# Patient Record
Sex: Female | Born: 1965 | Race: White | Hispanic: No | Marital: Married | Smoking: Never smoker
Health system: Southern US, Community
[De-identification: ages and names within clinical notes are randomized; demographics above are authoritative.]

## PROBLEM LIST (undated history)

## (undated) DIAGNOSIS — D229 Melanocytic nevi, unspecified: Secondary | ICD-10-CM

## (undated) HISTORY — PX: BREAST CYST EXCISION: SHX579

## (undated) HISTORY — DX: Melanocytic nevi, unspecified: D22.9

---

## 2013-08-09 DIAGNOSIS — D229 Melanocytic nevi, unspecified: Secondary | ICD-10-CM

## 2013-08-09 DIAGNOSIS — C4491 Basal cell carcinoma of skin, unspecified: Secondary | ICD-10-CM

## 2013-08-09 HISTORY — DX: Melanocytic nevi, unspecified: D22.9

## 2013-08-09 HISTORY — DX: Basal cell carcinoma of skin, unspecified: C44.91

## 2014-08-04 HISTORY — PX: BACK SURGERY: SHX140

## 2018-05-28 ENCOUNTER — Other Ambulatory Visit: Payer: Self-pay | Admitting: Family Medicine

## 2018-05-28 DIAGNOSIS — Z1231 Encounter for screening mammogram for malignant neoplasm of breast: Secondary | ICD-10-CM

## 2018-06-09 ENCOUNTER — Ambulatory Visit: Payer: Managed Care, Other (non HMO)

## 2018-06-17 ENCOUNTER — Ambulatory Visit: Payer: Managed Care, Other (non HMO)

## 2018-06-24 ENCOUNTER — Ambulatory Visit (INDEPENDENT_AMBULATORY_CARE_PROVIDER_SITE_OTHER): Payer: Managed Care, Other (non HMO)

## 2018-06-24 DIAGNOSIS — Z1231 Encounter for screening mammogram for malignant neoplasm of breast: Secondary | ICD-10-CM

## 2018-08-30 ENCOUNTER — Other Ambulatory Visit: Payer: Self-pay | Admitting: Family Medicine

## 2018-08-30 DIAGNOSIS — E041 Nontoxic single thyroid nodule: Secondary | ICD-10-CM

## 2018-09-01 ENCOUNTER — Other Ambulatory Visit: Payer: Self-pay | Admitting: Family Medicine

## 2018-09-01 DIAGNOSIS — G96198 Other disorders of meninges, not elsewhere classified: Secondary | ICD-10-CM

## 2018-09-01 DIAGNOSIS — G9619 Other disorders of meninges, not elsewhere classified: Principal | ICD-10-CM

## 2018-09-10 ENCOUNTER — Ambulatory Visit
Admission: RE | Admit: 2018-09-10 | Discharge: 2018-09-10 | Disposition: A | Discharge status: Home / Self Care | Payer: Managed Care, Other (non HMO) | Source: Ambulatory Visit | Attending: Family Medicine | Admitting: Family Medicine

## 2018-09-10 DIAGNOSIS — E041 Nontoxic single thyroid nodule: Secondary | ICD-10-CM

## 2018-09-20 ENCOUNTER — Ambulatory Visit (INDEPENDENT_AMBULATORY_CARE_PROVIDER_SITE_OTHER): Payer: Managed Care, Other (non HMO)

## 2018-09-20 DIAGNOSIS — G96198 Other disorders of meninges, not elsewhere classified: Secondary | ICD-10-CM

## 2018-09-20 DIAGNOSIS — M5136 Other intervertebral disc degeneration, lumbar region: Secondary | ICD-10-CM

## 2018-09-20 DIAGNOSIS — G9619 Other disorders of meninges, not elsewhere classified: Principal | ICD-10-CM

## 2018-09-20 MED ORDER — GADOBENATE DIMEGLUMINE 529 MG/ML IV SOLN
15.0000 mL | Freq: Once | INTRAVENOUS | Status: AC | PRN
  Administered 2018-09-20: 13 mL via INTRAVENOUS

## 2018-12-29 ENCOUNTER — Other Ambulatory Visit: Payer: Self-pay | Admitting: Family Medicine

## 2018-12-29 DIAGNOSIS — M545 Low back pain, unspecified: Secondary | ICD-10-CM

## 2018-12-29 DIAGNOSIS — N281 Cyst of kidney, acquired: Secondary | ICD-10-CM

## 2018-12-29 DIAGNOSIS — G8929 Other chronic pain: Secondary | ICD-10-CM

## 2019-01-06 ENCOUNTER — Ambulatory Visit (INDEPENDENT_AMBULATORY_CARE_PROVIDER_SITE_OTHER): Payer: Managed Care, Other (non HMO)

## 2019-01-06 ENCOUNTER — Other Ambulatory Visit: Payer: Self-pay

## 2019-01-06 DIAGNOSIS — M545 Low back pain, unspecified: Secondary | ICD-10-CM

## 2019-01-06 DIAGNOSIS — G8929 Other chronic pain: Secondary | ICD-10-CM

## 2019-01-06 DIAGNOSIS — N281 Cyst of kidney, acquired: Secondary | ICD-10-CM | POA: Diagnosis not present

## 2019-03-22 DIAGNOSIS — C439 Malignant melanoma of skin, unspecified: Secondary | ICD-10-CM

## 2019-03-22 HISTORY — DX: Malignant melanoma of skin, unspecified: C43.9

## 2019-04-09 IMAGING — US US THYROID
1 series · 13 of 25 positions shown · non-contrast
Comparison: None.

CLINICAL DATA: Incidental on MRI. History of right-sided thyroid
lobectomy now with thyroid nodule demonstrated on outside MRI.

EXAM:
THYROID ULTRASOUND
TECHNIQUE: Ultrasound examination of the thyroid gland and adjacent soft
tissues was performed.

[Series 1: us thyroid · 0.07mm/px · 13 of 28 slices shown]
[im 1/28]
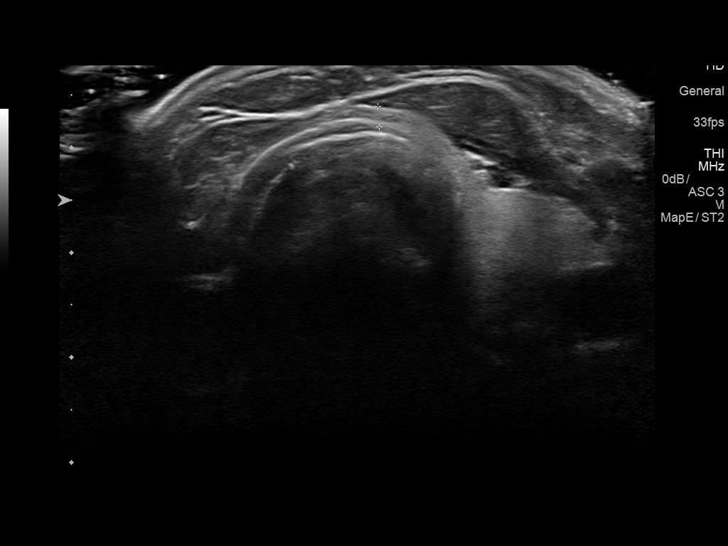
[im 3/28]
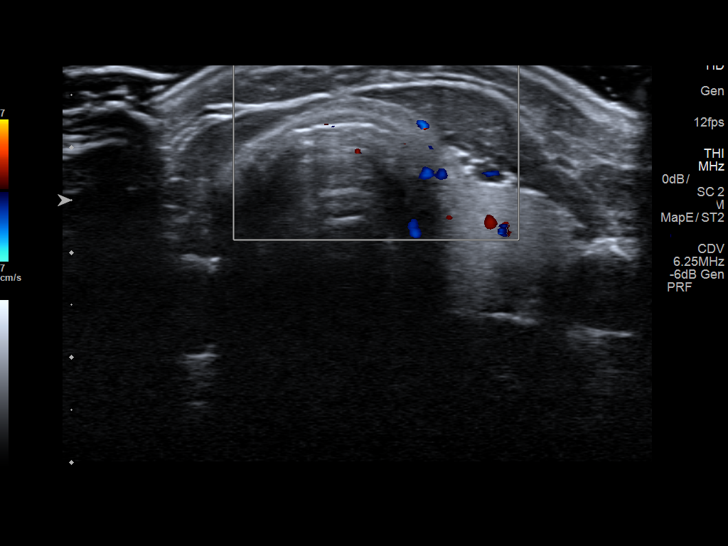
[im 5/28]
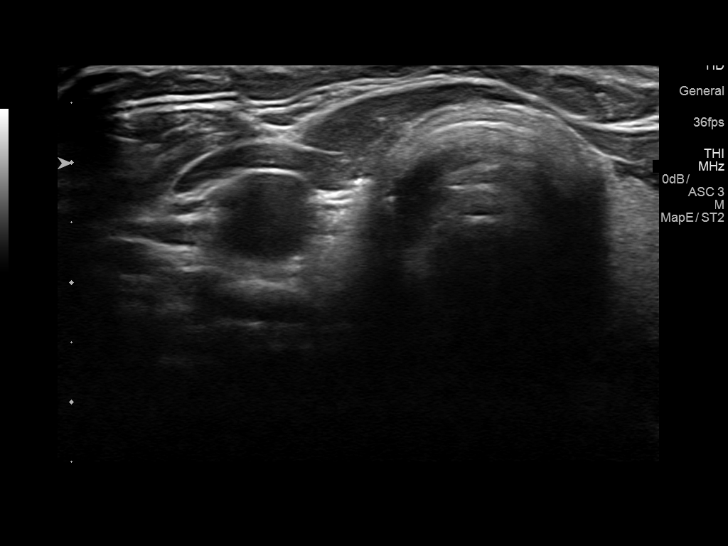
[im 7/28]
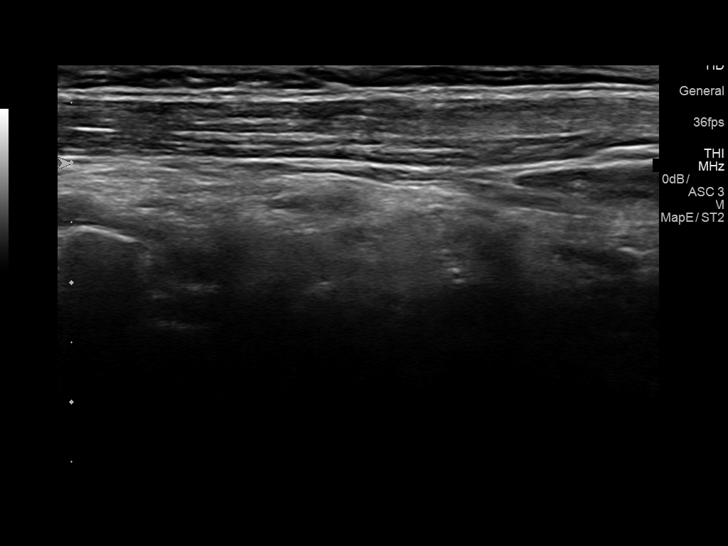
[im 10/28]
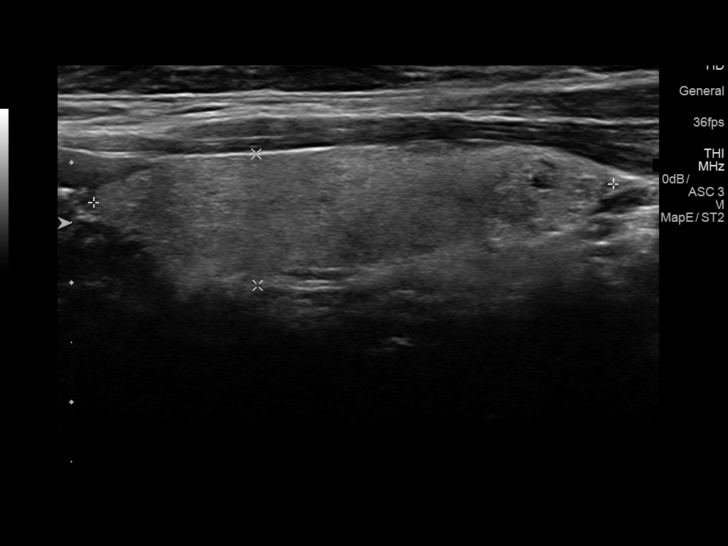
[im 12/28]
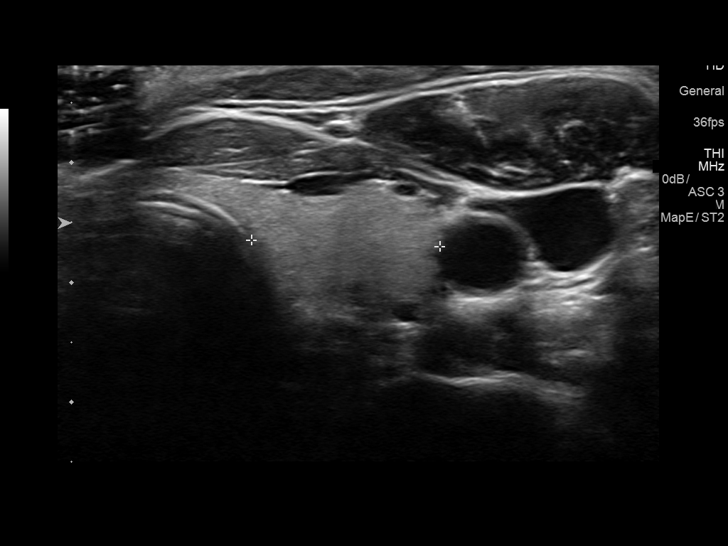
[im 14/28]
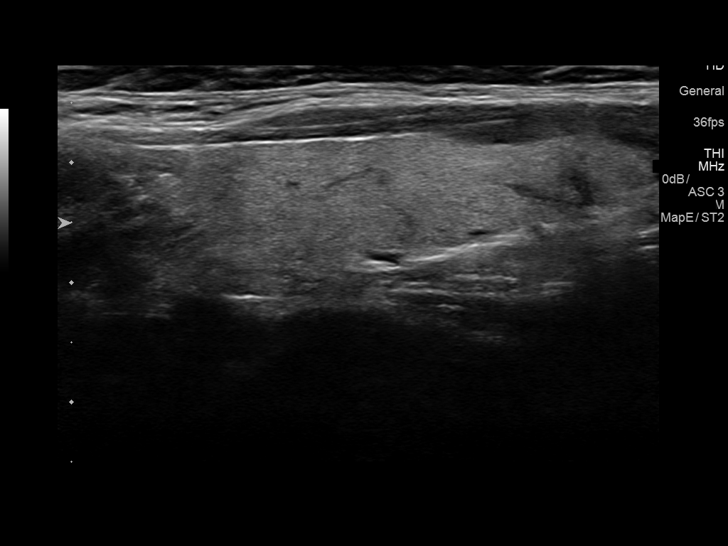
[im 16/28]
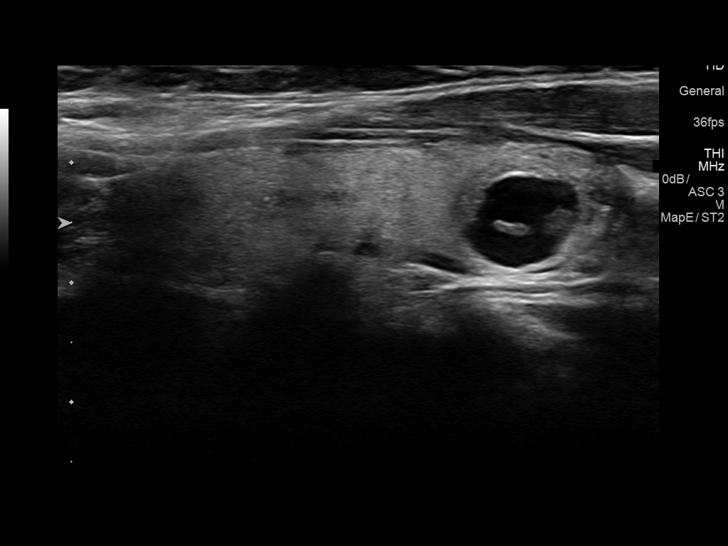
[im 19/28]
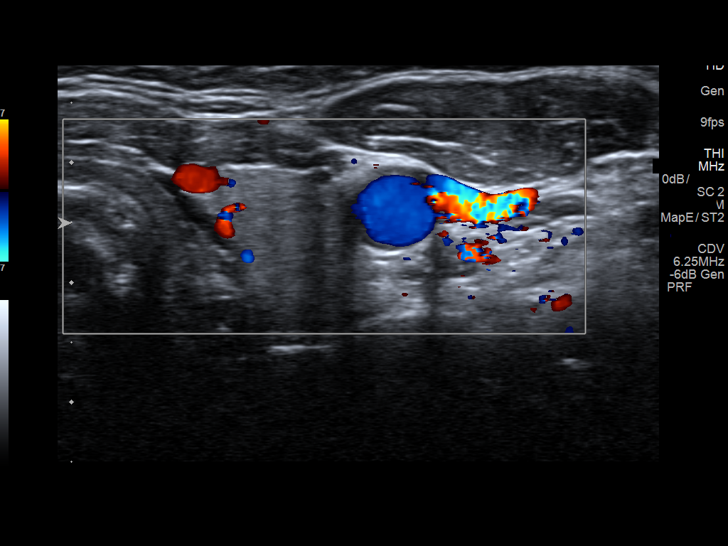
[im 21/28]
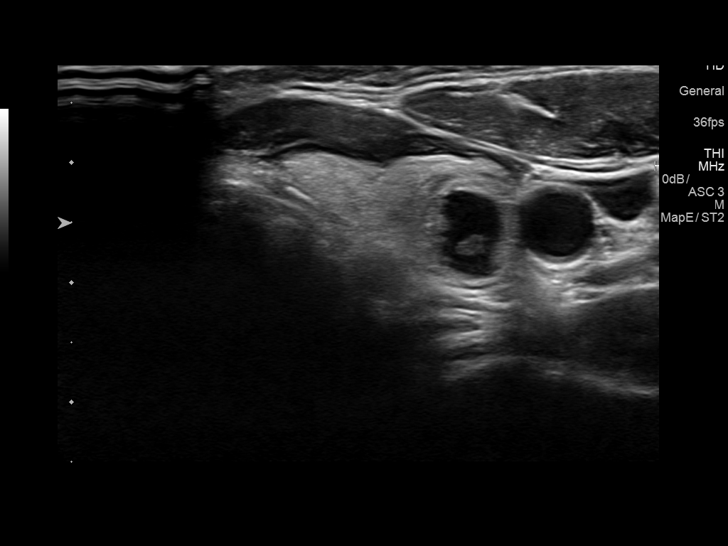
[im 23/28]
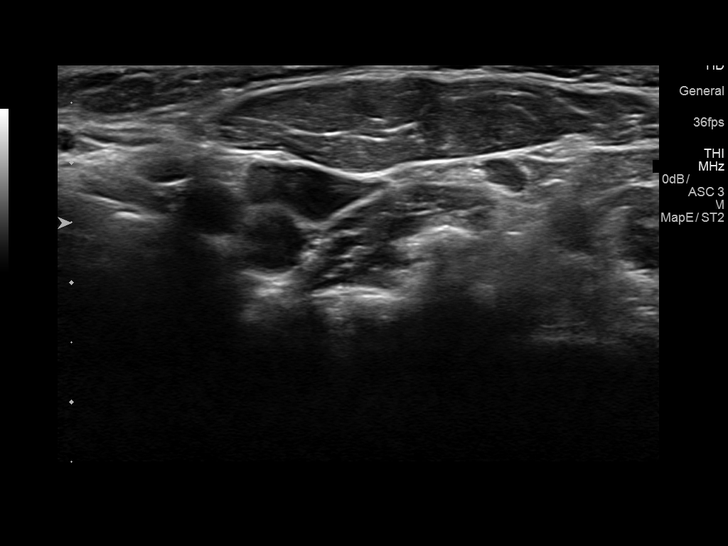
[im 25/28]
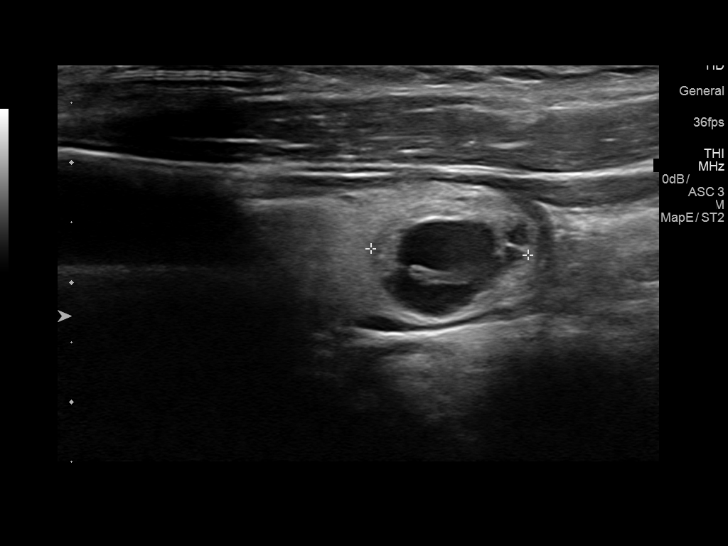
[im 28/28]
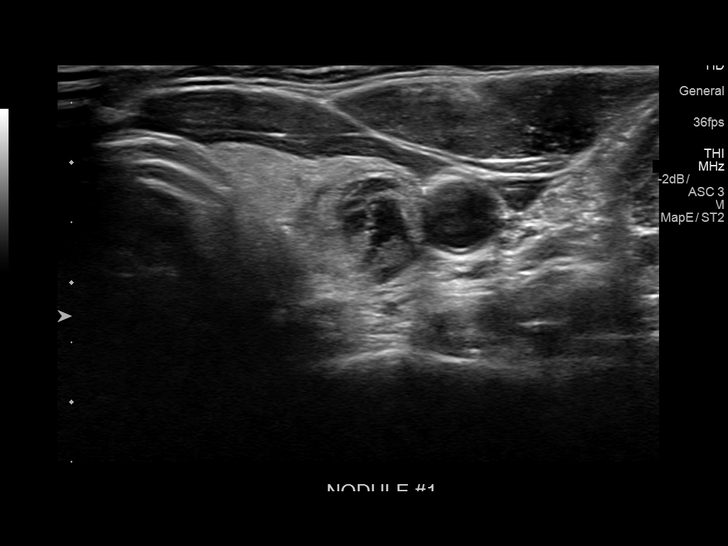

[13 of 25 positions shown; findings below may reference images not displayed]

FINDINGS: Parenchymal Echotexture: Mildly heterogenous

Isthmus: Normal in size measures 0.2 cm in diameter

Right lobe: Surgically absent. There is no residual nodular soft
tissue within the right lobectomy resection bed.

Left lobe: Normal in size measuring 4.3 x 1.1 x 1.6 cm

_________________________________________________________

Estimated total number of nodules >/= 1 cm: 1

Number of spongiform nodules >/=  2 cm not described below (TR1): 0

Number of mixed cystic and solid nodules >/= 1.5 cm not described
below (TR2): 0

_________________________________________________________

Nodule # 1:

Location: Left; Inferior

Maximum size: 1.3 cm; Other 2 dimensions: 0.9 x 0.8 cm

Composition: mixed cystic and solid (1)

Echogenicity: isoechoic (1)

Shape: not taller-than-wide (0)

Margins: ill-defined (0)

Echogenic foci: none (0)

ACR TI-RADS total points: 2.

ACR TI-RADS risk category: TR2 (2 points).

ACR TI-RADS recommendations:

This nodule does NOT meet TI-RADS criteria for biopsy or dedicated
follow-up.

_________________________________________________________
IMPRESSION: 1. Post right-sided thyroid lobectomy without evidence of residual
nodular soft tissue within the right lobectomy resection bed.
2. Solitary partially cystic, partially solid nodule within the
inferior pole the left lobe of the thyroid does not meet imaging
criteria to recommend percutaneous sampling or continued dedicated
follow-up.

The above is in keeping with the ACR TI-RADS recommendations - [HOSPITAL] 4284;[DATE].

## 2019-05-20 ENCOUNTER — Other Ambulatory Visit: Payer: Self-pay | Admitting: Family Medicine

## 2019-05-20 DIAGNOSIS — Z1231 Encounter for screening mammogram for malignant neoplasm of breast: Secondary | ICD-10-CM

## 2019-06-08 DIAGNOSIS — D229 Melanocytic nevi, unspecified: Secondary | ICD-10-CM

## 2019-06-08 HISTORY — DX: Melanocytic nevi, unspecified: D22.9

## 2019-07-07 ENCOUNTER — Ambulatory Visit: Payer: Managed Care, Other (non HMO)

## 2019-07-13 ENCOUNTER — Ambulatory Visit (INDEPENDENT_AMBULATORY_CARE_PROVIDER_SITE_OTHER): Payer: Managed Care, Other (non HMO) | Admitting: Cardiology

## 2019-07-13 ENCOUNTER — Encounter: Payer: Self-pay | Admitting: Cardiology

## 2019-07-13 ENCOUNTER — Encounter: Payer: Self-pay | Admitting: *Deleted

## 2019-07-13 ENCOUNTER — Ambulatory Visit: Payer: Managed Care, Other (non HMO)

## 2019-07-13 ENCOUNTER — Other Ambulatory Visit: Payer: Self-pay

## 2019-07-13 VITALS — BP 120/78 | HR 64 | Ht 67.0 in | Wt 130.0 lb

## 2019-07-13 DIAGNOSIS — Z01812 Encounter for preprocedural laboratory examination: Secondary | ICD-10-CM

## 2019-07-13 DIAGNOSIS — R079 Chest pain, unspecified: Secondary | ICD-10-CM | POA: Diagnosis not present

## 2019-07-13 DIAGNOSIS — R002 Palpitations: Secondary | ICD-10-CM | POA: Diagnosis not present

## 2019-07-13 DIAGNOSIS — R0600 Dyspnea, unspecified: Secondary | ICD-10-CM | POA: Diagnosis not present

## 2019-07-13 MED ORDER — METOPROLOL TARTRATE 50 MG PO TABS: ORAL_TABLET | ORAL | 0 refills | Status: DC

## 2019-07-13 NOTE — Patient Instructions (Signed)
Medication Instructions:  Your physician recommends that you continue on your current medications as directed. Please refer to the Current Medication list given to you today.  *If you need a refill on your cardiac medications before your next appointment, please call your pharmacy*  Lab Work: Your physician recommends that you return for lab work in:   3-7 days prior to CT: BMP  If you have labs (blood work) drawn today and your tests are completely normal, you will receive your results only by: [place] MyChart Message (if you have MyChart) OR . A paper copy in the mail If you have any lab test that is abnormal or we need to change your treatment, we will call you to review the results.  Testing/Procedures: Your physician has requested that you have an echocardiogram. Echocardiography is a painless test that uses sound waves to create images of your heart. It provides your doctor with information about the size and shape of your heart and how well your heart's chambers and valves are working. This procedure takes approximately one hour. There are no restrictions for this procedure.  A zio monitor was placed today. It will remain on for 14 days. You will then return monitor and event diary in provided box. It takes 1-2 weeks for report to be downloaded and returned to [place]. We will call you with the results. If monitor falls off or has orange flashing light, please call Zio for further instructions.   Your physician has requested that you have a lexiscan myoview. For further information please visit [email or web address]. Please follow instruction sheet, as given.          Follow-Up: At [facility], you and your health needs are our priority.  As part of our continuing mission to provide you with exceptional heart care, we have created designated Provider Care Teams.  These Care Teams include your primary Cardiologist (physician) and Advanced Practice Providers (APPs -  Physician Assistants and  Nurse Practitioners) who all work together to provide you with the care you need, when you need it.  Your next appointment:   3 month(s)  The format for your next appointment:   In Person  Provider:   Berniece Salines, DO  Other Instructions  Cardiac Nuclear Scan A cardiac nuclear scan is a test that is done to check the flow of blood to your heart. It is done when you are resting and when you are exercising. The test looks for problems such as:  Not enough blood reaching a portion of the heart.  The heart muscle not working as it should. You may need this test if:  You have heart disease.  You have had lab results that are not normal.  You have had heart surgery or a balloon procedure to open up blocked arteries (angioplasty).  You have chest pain.  You have shortness of breath. In this test, a special dye (tracer) is put into your bloodstream. The tracer will travel to your heart. A camera will then take pictures of your heart to see how the tracer moves through your heart. This test is usually done at a hospital and takes 2-4 hours. Tell a doctor about:  Any allergies you have.  All medicines you are taking, including vitamins, herbs, eye drops, creams, and over-the-counter medicines.  Any problems you or family members have had with anesthetic medicines.  Any blood disorders you have.  Any surgeries you have had.  Any medical conditions you have.  Whether you are pregnant or  may be pregnant. What are the risks? Generally, this is a safe test. However, problems may occur, such as:  Serious chest pain and heart attack. This is only a risk if the stress portion of the test is done.  Rapid heartbeat.  A feeling of warmth in your chest. This feeling usually does not last long.  Allergic reaction to the tracer. What happens before the test?  Ask your doctor about changing or stopping your normal medicines. This is important.  Follow instructions from your doctor  about what you cannot eat or drink.  Remove your jewelry on the day of the test. What happens during the test?  An IV tube will be inserted into one of your veins.  Your doctor will give you a small amount of tracer through the IV tube.  You will wait for 20-40 minutes while the tracer moves through your bloodstream.  Your heart will be monitored with an electrocardiogram (ECG).  You will lie down on an exam table.  Pictures of your heart will be taken for about 15-20 minutes.  You may also have a stress test. For this test, one of these things may be done: ? You will be asked to exercise on a treadmill or a stationary bike. ? You will be given medicines that will make your heart work harder. This is done if you are unable to exercise.  When blood flow to your heart has peaked, a tracer will again be given through the IV tube.  After 20-40 minutes, you will get back on the exam table. More pictures will be taken of your heart.  Depending on the tracer that is used, more pictures may need to be taken 3-4 hours later.  Your IV tube will be removed when the test is over. The test may vary among doctors and hospitals. What happens after the test?  Ask your doctor: ? Whether you can return to your normal schedule, including diet, activities, and medicines. ? Whether you should drink more fluids. This will help to remove the tracer from your body. Drink enough fluid to keep your pee (urine) pale yellow.  Ask your doctor, or the department that is doing the test: ? When will my results be ready? ? How will I get my results? Summary  A cardiac nuclear scan is a test that is done to check the flow of blood to your heart.  Tell your doctor whether you are pregnant or may be pregnant.  Before the test, ask your doctor about changing or stopping your normal medicines. This is important.  Ask your doctor whether you can return to your normal activities. You may be asked to drink more  fluids. This information is not intended to replace advice given to you by your health care provider. Make sure you discuss any questions you have with your health care provider. Document Released: 01/04/2018 Document Revised: 11/10/2018 Document Reviewed: 01/04/2018 Elsevier Patient Education  2020 [employer].

## 2019-07-13 NOTE — Progress Notes (Signed)
Cardiology Office Note:    Date:  07/13/2019   ID:  Christine Irwin, DOB August 11, 1965, MRN [id number]  PCP:  Aura Dials, MD  Cardiologist:  Berniece Salines, DO  Electrophysiologist:  None   Referring MD: Aura Dials, MD   Chief Complaint  Patient presents with   Palpitations    History of Present Illness:    Christine Irwin is a 53 y.o. female with a hx of hypothyroidism, history of melanoma and family history of premature coronary disease presents today to be evaluated for palpitations, and chest pain.  Patient tells me that over 5 years she has been experiencing intermittent palpitations but recently this has become worse.  She notes that gets these increase the frequency of rapid onset of fast heartbeat which goes for seconds and sometimes many other times prior to resolution.  It is associated with shortness of breath.  She admits that she sometimes has some lightheadedness with her palpitations.  In addition he has been experiencing midsternal intermittent chest pain which lasts for 10 to 15 seconds prior to resolution.  She denies any radiation, and states that she does have associated shortness of breath with this as well.  Nothing has made this better or worse.  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   Calcium Carbonate-Vitamin D 600-400 MG-UNIT tablet Take by mouth.   diphenhydrAMINE HCl (ALLERGY MEDICATION PO) Take by mouth.   levothyroxine (SYNTHROID) 50 MCG tablet Take 50 mcg by mouth daily.   Multiple Vitamin (MULTIVITAMIN) tablet Take by mouth.   Probiotic Product (PROBIOTIC-10 PO) Take by mouth.     Allergies:   Eggs or egg-derived products and Iodine   Social History   Socioeconomic History   Marital status:  Married    Spouse name: Not on file   Number of children: Not on file   Years of education: Not on file   Highest education level: Not on file  Occupational History   Not on file  Social Needs   Financial resource strain: Not on file   Food insecurity    Worry: Not on file    Inability: Not on file   Transportation needs    Medical: Not on file    Non-medical: Not on file  Tobacco Use   Smoking status: Never Smoker   Smokeless tobacco: Never Used  Substance and Sexual Activity   Alcohol use: Never    Frequency: Never   Drug use: Never   Sexual activity: Not on file  Lifestyle   Physical activity    Days per week: Not on file    Minutes per session: Not on file   Stress: Not on file  Relationships   Social connections    Talks on phone: Not on file    Gets together: Not on file    Attends religious service: Not on file    Active member of club or organization: Not on file    Attends meetings of clubs or organizations: Not on file    Relationship status: Not on file  Other Topics Concern   Not on file  Social History Narrative   Not on file     Family History: The patient's family history includes Diabetes  in her brother and mother; Heart attack in her mother; Parkinson's disease in her father.  ROS:   Review of Systems  Constitution: Negative for decreased appetite, fever and weight gain.  HENT: Negative for congestion, ear discharge, hoarse voice and sore throat.   Eyes: Negative for discharge, redness, vision loss in right eye and visual halos.  Cardiovascular: Negative for chest pain, dyspnea on exertion, leg swelling, orthopnea and palpitations.  Respiratory: Negative for cough, hemoptysis, shortness of breath and snoring.   Endocrine: Negative for heat intolerance and polyphagia.  Hematologic/Lymphatic: Negative for bleeding problem. Does not bruise/bleed easily.  Skin: Negative for flushing, nail changes, rash and suspicious lesions.    Musculoskeletal: Negative for arthritis, joint pain, muscle cramps, myalgias, neck pain and stiffness.  Gastrointestinal: Negative for abdominal pain, bowel incontinence, diarrhea and excessive appetite.  Genitourinary: Negative for decreased libido, genital sores and incomplete emptying.  Neurological: Negative for brief paralysis, focal weakness, headaches and loss of balance.  Psychiatric/Behavioral: Negative for altered mental status, depression and suicidal ideas.  Allergic/Immunologic: Negative for HIV exposure and persistent infections.    EKGs/Labs/Other Studies Reviewed:    The following studies were reviewed today:   EKG:  The ekg ordered today demonstrates   Recent Labs: No results found for requested labs within last 8760 hours.  Recent Lipid Panel No results found for: CHOL, TRIG, HDL, CHOLHDL, VLDL, LDLCALC, LDLDIRECT  Physical Exam:    VS:  BP 120/78 (BP Location: Left Arm, Patient Position: Sitting, Cuff Size: Normal)    Pulse 64    Ht 5\' 7"  (1.702 m)    Wt 130 lb (59 kg)    LMP  (LMP Unknown)    SpO2 99%    BMI 20.36 kg/m     Wt Readings from Last 3 Encounters:  07/13/19 130 lb (59 kg)     GEN: Well nourished, well developed in no acute distress HEENT: Normal NECK: No JVD; No carotid bruits LYMPHATICS: No lymphadenopathy CARDIAC: S1S2 noted,RRR, no murmurs, rubs, gallops RESPIRATORY:  Clear to auscultation without rales, wheezing or rhonchi  ABDOMEN: Soft, non-tender, non-distended, +bowel sounds, no guarding. EXTREMITIES: No edema, No cyanosis, no clubbing MUSCULOSKELETAL:  No edema; No deformity  SKIN: Warm and dry NEUROLOGIC:  Alert and oriented x 3, non-focal PSYCHIATRIC:  Normal affect, good insight  ASSESSMENT:    1. Palpitations   2. Chest pain, unspecified type   3. Pre-procedure lab exam   4. Dyspnea, unspecified type    PLAN:    1.  Giving her chest pain and family history of coronary artery disease, with all of her risk factors I am  certain about this patient and would like to pursue ischemic evaluation.  I have educated patient on a dermatologic nuclear stress test and she is agreeable to proceed with this testing.  2.  I would like to rule out a cardiovascular etiology of this palpitation, therefore at this time I would like to placed a zio patch for   routine days. In additon a transthoracic echocardiogram will be ordered to assess LV/RV function and any structural abnormalities. Once these testing have been performed amd reviewed further reccomendations will be made. For now, I do reccomend that the patient goes to the nearest ED if  symptoms recur.  The patient is in agreement with the above plan. The patient left the office in stable condition.  The patient will follow up in 3 months or sooner if needed.   Medication Adjustments/Labs and Tests Ordered: Current medicines  are reviewed at length with the patient today.  Concerns regarding medicines are outlined above.  Orders Placed This Encounter  Procedures   LONG TERM MONITOR (3-14 DAYS)   MYOCARDIAL PERFUSION IMAGING   EKG 12-Lead   ECHOCARDIOGRAM COMPLETE   Meds ordered this encounter  Medications   DISCONTD: metoprolol tartrate (LOPRESSOR) 50 MG tablet    Sig: Take 2 tabs (100 mg) 2 hours prior to CT if heart rate greater than 55    Dispense:  2 tablet    Refill:  0    Patient Instructions  Medication Instructions:  Your physician recommends that you continue on your current medications as directed. Please refer to the Current Medication list given to you today.  *If you need a refill on your cardiac medications before your next appointment, please call your pharmacy*  Lab Work: Your physician recommends that you return for lab work in:   3-7 days prior to CT: BMP  If you have labs (blood work) drawn today and your tests are completely normal, you will receive your results only by:  [place] (if you have MyChart) OR  A paper copy in  the mail If you have any lab test that is abnormal or we need to change your treatment, we will call you to review the results.  Testing/Procedures: Your physician has requested that you have an echocardiogram. Echocardiography is a painless test that uses sound waves to create images of your heart. It provides your doctor with information about the size and shape of your heart and how well your hearts chambers and valves are working. This procedure takes approximately one hour. There are no restrictions for this procedure.  A zio monitor was placed today. It will remain on for 14 days. You will then return monitor and event diary in provided box. It takes 1-2 weeks for report to be downloaded and returned to [place]. We will call you with the results. If monitor falls off or has orange flashing light, please call Zio for further instructions.   Your physician has requested that you have a lexiscan myoview. For further information please visit [email or web address]. Please follow instruction sheet, as given.          Follow-Up: At [facility], you and your health needs are our priority.  As part of our continuing mission to provide you with exceptional heart care, we have created designated Provider Care Teams.  These Care Teams include your primary Cardiologist (physician) and Advanced Practice Providers (APPs -  Physician Assistants and Nurse Practitioners) who all work together to provide you with the care you need, when you need it.  Your next appointment:   3 month(s)  The format for your next appointment:   In Person  Provider:   Berniece Salines, DO  Other Instructions  Cardiac Nuclear Scan A cardiac nuclear scan is a test that is done to check the flow of blood to your heart. It is done when you are resting and when you are exercising. The test looks for problems such as:  Not enough blood reaching a portion of the heart.  The heart muscle not working as it should. You may need this  test if:  You have heart disease.  You have had lab results that are not normal.  You have had heart surgery or a balloon procedure to open up blocked arteries (angioplasty).  You have chest pain.  You have shortness of breath. In this test, a special dye (tracer) is put into  your bloodstream. The tracer will travel to your heart. A camera will then take pictures of your heart to see how the tracer moves through your heart. This test is usually done at a hospital and takes 2-4 hours. Tell a doctor about:  Any allergies you have.  All medicines you are taking, including vitamins, herbs, eye drops, creams, and over-the-counter medicines.  Any problems you or family members have had with anesthetic medicines.  Any blood disorders you have.  Any surgeries you have had.  Any medical conditions you have.  Whether you are pregnant or may be pregnant. What are the risks? Generally, this is a safe test. However, problems may occur, such as:  Serious chest pain and heart attack. This is only a risk if the stress portion of the test is done.  Rapid heartbeat.  A feeling of warmth in your chest. This feeling usually does not last long.  Allergic reaction to the tracer. What happens before the test?  Ask your doctor about changing or stopping your normal medicines. This is important.  Follow instructions from your doctor about what you cannot eat or drink.  Remove your jewelry on the day of the test. What happens during the test?  An IV tube will be inserted into one of your veins.  Your doctor will give you a small amount of tracer through the IV tube.  You will wait for 20-40 minutes while the tracer moves through your bloodstream.  Your heart will be monitored with an electrocardiogram (ECG).  You will lie down on an exam table.  Pictures of your heart will be taken for about 15-20 minutes.  You may also have a stress test. For this test, one of these things may be  done: ? You will be asked to exercise on a treadmill or a stationary bike. ? You will be given medicines that will make your heart work harder. This is done if you are unable to exercise.  When blood flow to your heart has peaked, a tracer will again be given through the IV tube.  After 20-40 minutes, you will get back on the exam table. More pictures will be taken of your heart.  Depending on the tracer that is used, more pictures may need to be taken 3-4 hours later.  Your IV tube will be removed when the test is over. The test may vary among doctors and hospitals. What happens after the test?  Ask your doctor: ? Whether you can return to your normal schedule, including diet, activities, and medicines. ? Whether you should drink more fluids. This will help to remove the tracer from your body. Drink enough fluid to keep your pee (urine) pale yellow.  Ask your doctor, or the department that is doing the test: ? When will my results be ready? ? How will I get my results? Summary  A cardiac nuclear scan is a test that is done to check the flow of blood to your heart.  Tell your doctor whether you are pregnant or may be pregnant.  Before the test, ask your doctor about changing or stopping your normal medicines. This is important.  Ask your doctor whether you can return to your normal activities. You may be asked to drink more fluids. This information is not intended to replace advice given to you by your health care provider. Make sure you discuss any questions you have with your health care provider. Document Released: 01/04/2018 Document Revised: 11/10/2018 Document Reviewed: 01/04/2018 Elsevier Patient  Education  [employer].      Adopting a Healthy Lifestyle.  Know what a healthy weight is for you (roughly BMI <25) and aim to maintain this   Aim for 7+ servings of fruits and vegetables daily   65-80+ fluid ounces of water or unsweet tea for healthy kidneys    Limit to max 1 drink of alcohol per day; avoid smoking/tobacco   Limit animal fats in diet for cholesterol and heart health - choose grass fed whenever available   Avoid highly processed foods, and foods high in saturated/trans fats   Aim for low stress - take time to unwind and care for your mental health   Aim for 150 min of moderate intensity exercise weekly for heart health, and weights twice weekly for bone health   Aim for 7-9 hours of sleep daily   When it comes to diets, agreement about the perfect plan isnt easy to find, even among the experts. Experts at the [employer] developed an idea known as the Healthy Eating Plate. Just imagine a plate divided into logical, healthy portions.   The emphasis is on diet quality:   Load up on vegetables and fruits - one-half of your plate: Aim for color and variety, and remember that potatoes dont count.   Go for whole grains - one-quarter of your plate: Whole wheat, barley, wheat berries, quinoa, oats, brown rice, and foods made with them. If you want pasta, go with whole wheat pasta.   Protein power - one-quarter of your plate: Fish, chicken, beans, and nuts are all healthy, versatile protein sources. Limit red meat.   The diet, however, does go beyond the plate, offering a few other suggestions.   Use healthy plant oils, such as olive, canola, soy, corn, sunflower and peanut. Check the labels, and avoid partially hydrogenated oil, which have unhealthy trans fats.   If youre thirsty, drink water. Coffee and tea are good in moderation, but skip sugary drinks and limit milk and dairy products to one or two daily servings.   The type of carbohydrate in the diet is more important than the amount. Some sources of carbohydrates, such as vegetables, fruits, whole grains, and beans-are healthier than others.   Finally, stay active  Signed, Berniece Salines, DO  07/13/2019 8:20 PM    [facility]

## 2019-07-14 ENCOUNTER — Ambulatory Visit (HOSPITAL_BASED_OUTPATIENT_CLINIC_OR_DEPARTMENT_OTHER): Admission: RE | Admit: 2019-07-14 | Payer: Managed Care, Other (non HMO) | Source: Ambulatory Visit

## 2019-07-20 ENCOUNTER — Telehealth: Payer: Self-pay | Admitting: Cardiology

## 2019-07-20 NOTE — Telephone Encounter (Signed)
Telephone call to patient. Very upset that monitor was placed on her without getting prior approval from insurance company. Explained that insurance was filed with her insurance she provided.States she has no money and can't afford the nuclear stress stress test or the echo. Gave her [place] phone number and she asked that tracy call her back

## 2019-07-20 NOTE — Telephone Encounter (Signed)
Patient is very concerned about the monitor and the cost of it, she was not informed she states of anything, please call her

## 2019-07-22 ENCOUNTER — Encounter (HOSPITAL_COMMUNITY): Payer: Managed Care, Other (non HMO)

## 2019-07-22 NOTE — Telephone Encounter (Signed)
Follow up  Patient is calling back in about the heart monitor, states that her monitor has fell off (all of the gel has came off of the sticky pads) and states that she is having to pay $400 out of pocket for the monitor and can not afford that and also states that she does not want to be responsible for. Patient states that nothing was reviewed with her insurance or her prior to the doctor putting the monitor on her. Please give patient a call and assist.

## 2019-07-25 NOTE — Telephone Encounter (Signed)
Left message to return call 

## 2019-07-25 NOTE — Telephone Encounter (Signed)
Spoke with Counsellor at [employer]. She is looking into the situation and will call me back. She states the patient  Hasn't received a bill because they don't bill until after the monitor is returned and insurance has been filed. Worses comes to worse Christine Irwin says if she doesn't return the monitor they can not bill anyway. Will await [name]'s call.

## 2019-07-25 NOTE — Telephone Encounter (Signed)
Telephone call back from patient. States her deductible has not been met and she can't afford the nuclear stress test,echo or monitor that was ordered until after the 1st of the year.States she will not pay for the monitor because it fell off on day 7 of 14. Informed her we could still have results from the days it was on but she states she will not turn it in to the company. She tells me I need to contact [employer] and relay information that she won't pay. She does not want this charge placed in her name. I will call [employer] to see what I can do to rectify thi situation.

## 2019-10-06 ENCOUNTER — Ambulatory Visit: Payer: Managed Care, Other (non HMO) | Admitting: Cardiology

## 2019-10-10 ENCOUNTER — Telehealth (HOSPITAL_COMMUNITY): Payer: Self-pay | Admitting: Radiology

## 2019-10-10 ENCOUNTER — Telehealth (HOSPITAL_COMMUNITY): Payer: Self-pay | Admitting: *Deleted

## 2019-10-10 NOTE — Telephone Encounter (Signed)
Left message on voicemail in reference to upcoming appointment scheduled for 10/12/19 Phone number given for a call back so details instructions can be given. Christine Irwin

## 2019-10-10 NOTE — Telephone Encounter (Signed)
Patient given detailed instructions per Myocardial Perfusion Study Information Sheet for the test on 10/12/2019 at 7:15. Patient notified to arrive 15 minutes early and that it is imperative to arrive on time for appointment to keep from having the test rescheduled.  If you need to cancel or reschedule your appointment, please call the office within 24 hours of your appointment. . Patient verbalized understanding.EHK  Pt is not sure if she will be able to do the test because of financial reasons. She will contact her insurance company and call us back.

## 2019-10-12 ENCOUNTER — Other Ambulatory Visit: Payer: Self-pay

## 2019-10-12 ENCOUNTER — Ambulatory Visit (HOSPITAL_COMMUNITY): Payer: No Typology Code available for payment source | Attending: Cardiology

## 2019-10-12 ENCOUNTER — Encounter (INDEPENDENT_AMBULATORY_CARE_PROVIDER_SITE_OTHER): Payer: Self-pay

## 2019-10-12 VITALS — Ht 67.0 in | Wt 130.0 lb

## 2019-10-12 DIAGNOSIS — R079 Chest pain, unspecified: Secondary | ICD-10-CM | POA: Diagnosis present

## 2019-10-12 DIAGNOSIS — R110 Nausea: Secondary | ICD-10-CM | POA: Insufficient documentation

## 2019-10-12 DIAGNOSIS — R0600 Dyspnea, unspecified: Secondary | ICD-10-CM | POA: Diagnosis present

## 2019-10-12 LAB — MYOCARDIAL PERFUSION IMAGING
LV dias vol: 61 mL (ref 46–106)
LV sys vol: 21 mL
Peak HR: 101 {beats}/min
Rest HR: 71 {beats}/min
SDS: 4
SRS: 4
SSS: 8
TID: 0.95

## 2019-10-12 MED ORDER — REGADENOSON 0.4 MG/5ML IV SOLN
0.4000 mg | Freq: Once | INTRAVENOUS | Status: AC
  Administered 2019-10-12: 0.4 mg via INTRAVENOUS

## 2019-10-12 MED ORDER — AMINOPHYLLINE 25 MG/ML IV SOLN
150.0000 mg | Freq: Once | INTRAVENOUS | Status: AC
  Administered 2019-10-12: 150 mg via INTRAVENOUS

## 2019-10-12 MED ORDER — TECHNETIUM TC 99M TETROFOSMIN IV KIT
10.8000 | PACK | Freq: Once | INTRAVENOUS | Status: AC | PRN
  Administered 2019-10-12: 10.8 via INTRAVENOUS
  Filled 2019-10-12: qty 11

## 2019-10-12 MED ORDER — TECHNETIUM TC 99M TETROFOSMIN IV KIT
32.7000 | PACK | Freq: Once | INTRAVENOUS | Status: AC | PRN
  Administered 2019-10-12: 32.7 via INTRAVENOUS
  Filled 2019-10-12: qty 33

## 2019-11-02 ENCOUNTER — Ambulatory Visit: Payer: Managed Care, Other (non HMO) | Admitting: Cardiology

## 2019-11-16 ENCOUNTER — Other Ambulatory Visit: Payer: Self-pay

## 2019-11-16 ENCOUNTER — Encounter (INDEPENDENT_AMBULATORY_CARE_PROVIDER_SITE_OTHER): Payer: Self-pay

## 2019-11-16 ENCOUNTER — Encounter: Payer: Self-pay | Admitting: Dermatology

## 2019-11-16 ENCOUNTER — Ambulatory Visit: Payer: No Typology Code available for payment source | Admitting: Dermatology

## 2019-11-16 DIAGNOSIS — Z1283 Encounter for screening for malignant neoplasm of skin: Secondary | ICD-10-CM | POA: Diagnosis not present

## 2019-11-16 DIAGNOSIS — L603 Nail dystrophy: Secondary | ICD-10-CM

## 2019-11-16 DIAGNOSIS — L814 Other melanin hyperpigmentation: Secondary | ICD-10-CM | POA: Diagnosis not present

## 2019-11-16 DIAGNOSIS — Z85820 Personal history of malignant melanoma of skin: Secondary | ICD-10-CM

## 2019-11-16 DIAGNOSIS — L821 Other seborrheic keratosis: Secondary | ICD-10-CM

## 2019-11-16 DIAGNOSIS — D1801 Hemangioma of skin and subcutaneous tissue: Secondary | ICD-10-CM

## 2019-11-16 NOTE — Progress Notes (Signed)
   Follow-Up Visit   Subjective  Christine Irwin is a 54 y.o. female who presents for the following: Annual Exam (no new concerns).  Check moles Location: new brown spots legs Duration: few months Quality:  Associated Signs/Symptoms: Modifying Factors:  Severity:  Timing: Context: history melanoma left foot  The following portions of the chart were reviewed this encounter and updated as appropriate: Tobacco  Allergies  Meds  Problems  Med Hx  Surg Hx  Fam Hx      Objective  Well appearing patient in no apparent distress; mood and affect are within normal limits.  A focused examination was performed including arms, legs, head and neck, back. Relevant physical exam findings are noted in the Assessment and Plan. General skin examination done.  There is no recurrence of the melanoma on her left ankle or atypical moles.  There are no new atypical moles.  Clinically benign lesions include 73mm brown macules; dermoscopy equals lentigo simplex.  Angioma right thigh.  Multiple small keratoses under her breasts.  Splitting of her right fifth toenail is more likely mechanical than fungal and I discouraged her from taking oral antifungals.  She may try one step superglue which she could follow down and cover with nail enamel.  Routine check in 1 year unless she sees any clinical changes.  Assessment & Plan

## 2019-11-24 ENCOUNTER — Ambulatory Visit: Payer: Managed Care, Other (non HMO) | Admitting: Cardiology

## 2019-11-28 IMAGING — US US RENAL
1 series · 14 of 25 positions shown · non-contrast
Comparison: MRI September 20, 2018.

CLINICAL DATA: Renal cyst.

EXAM:
RENAL / URINARY TRACT ULTRASOUND COMPLETE

[Series 1: us renal · 0.22mm/px · 14 of 71 slices shown]
[im 1/71]
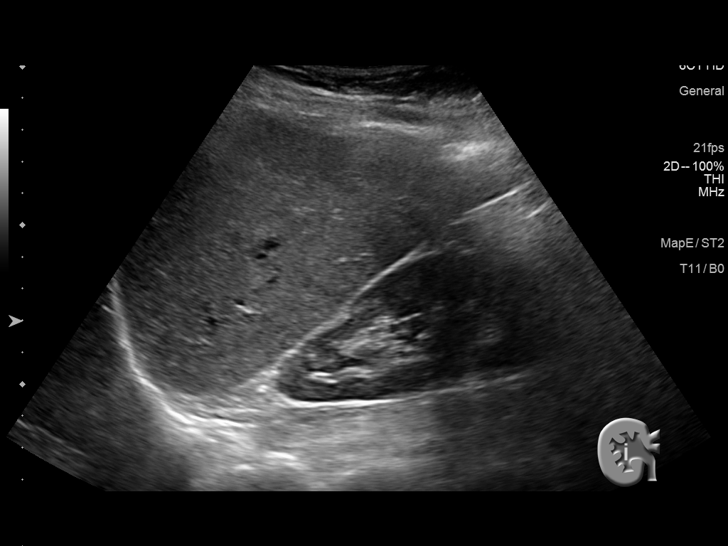
[im 6/71]
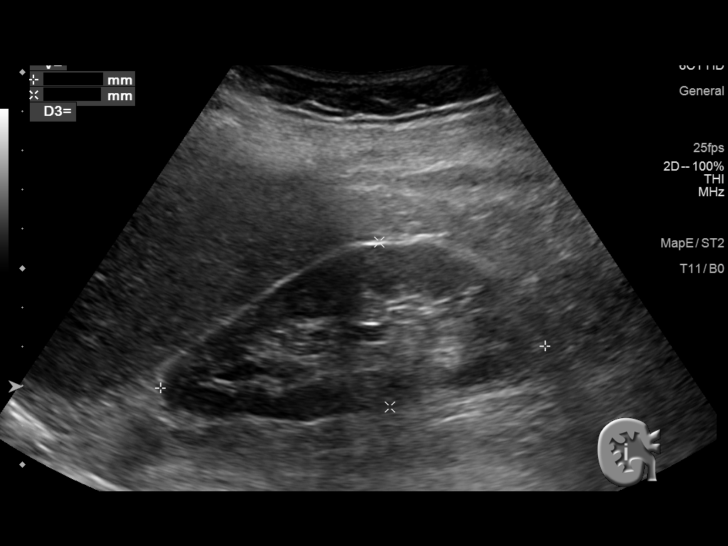
[im 12/71]
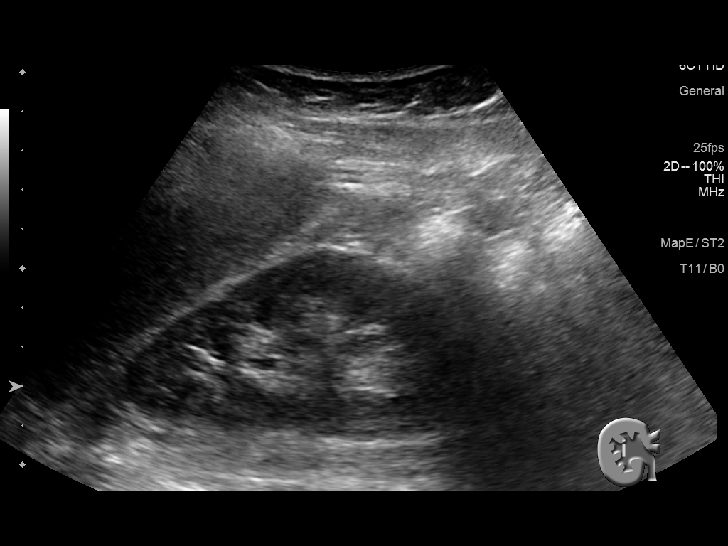
[im 18/71]
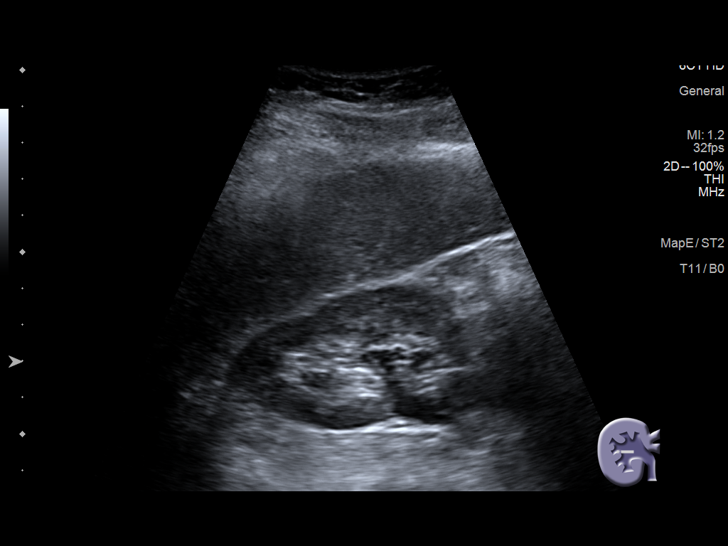
[im 24/71]
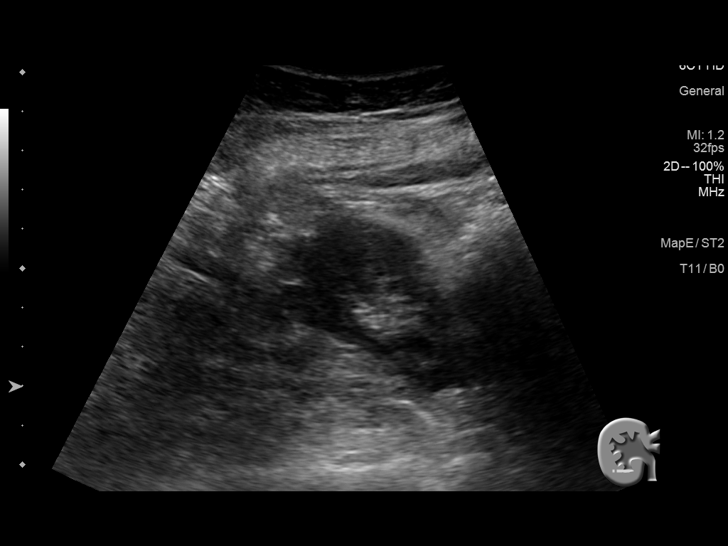
[im 27/71]
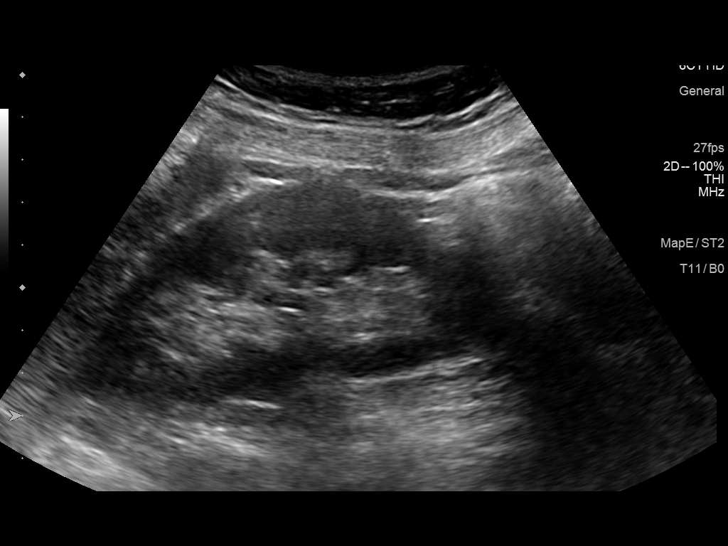
[im 33/71]
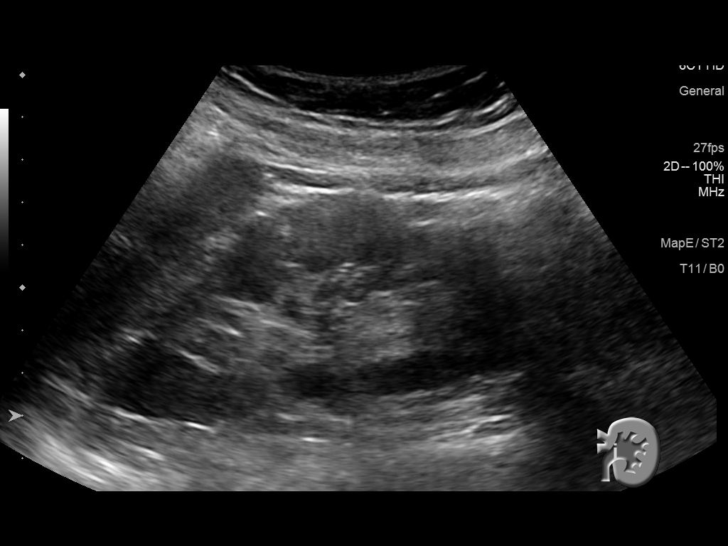
[im 38/71]
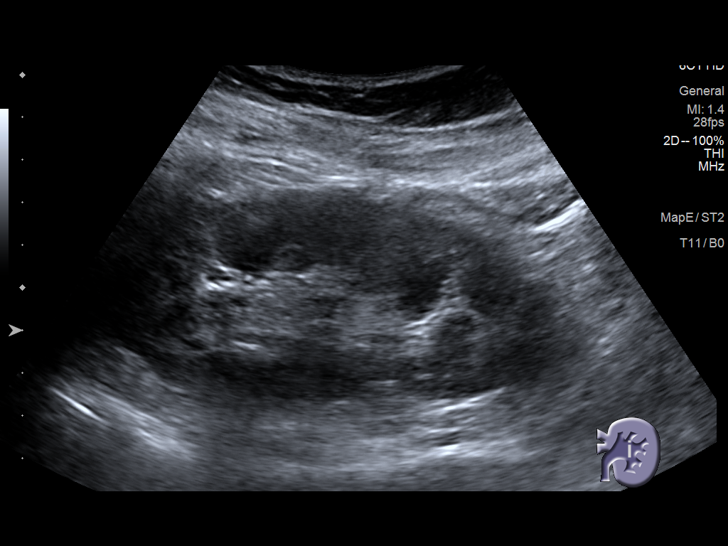
[im 44/71]
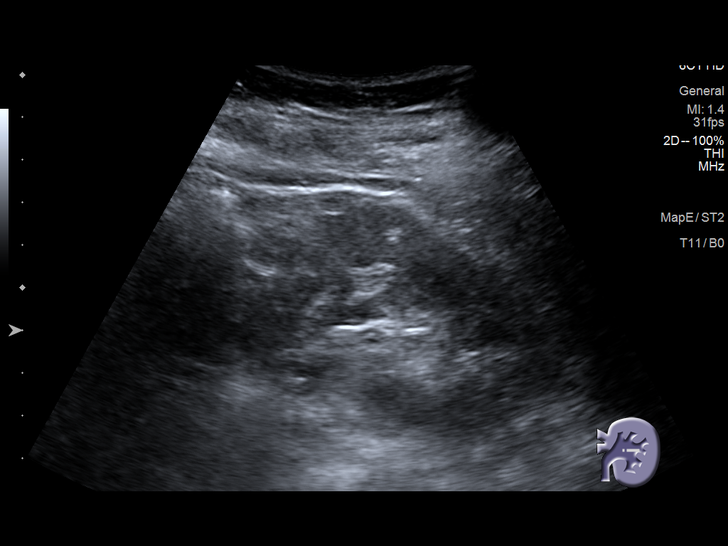
[im 47/71]
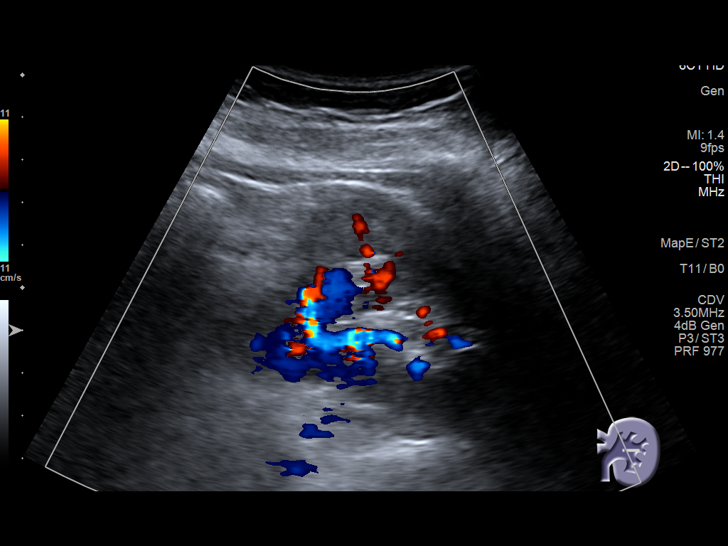
[im 53/71]
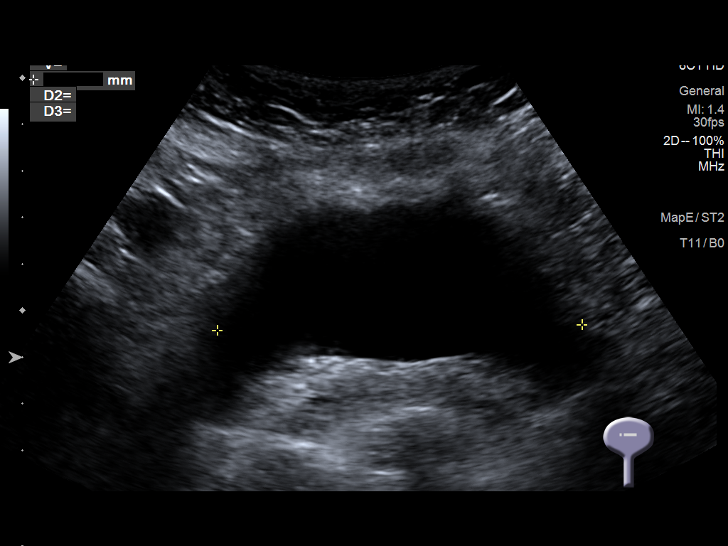
[im 59/71]
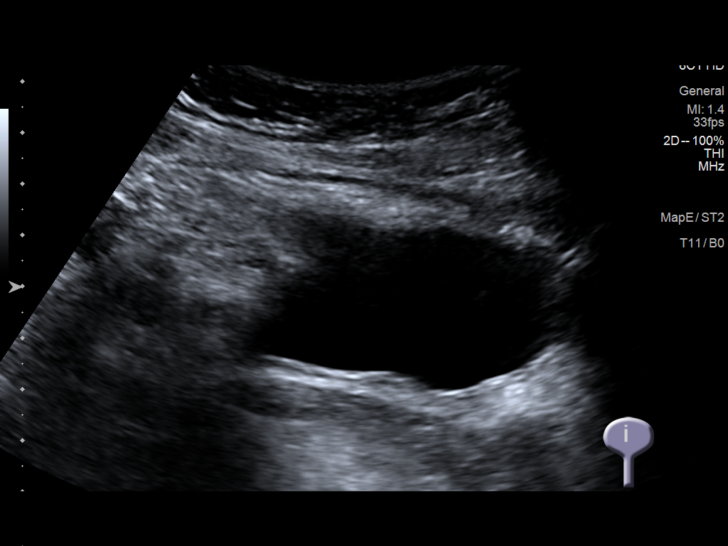
[im 65/71]
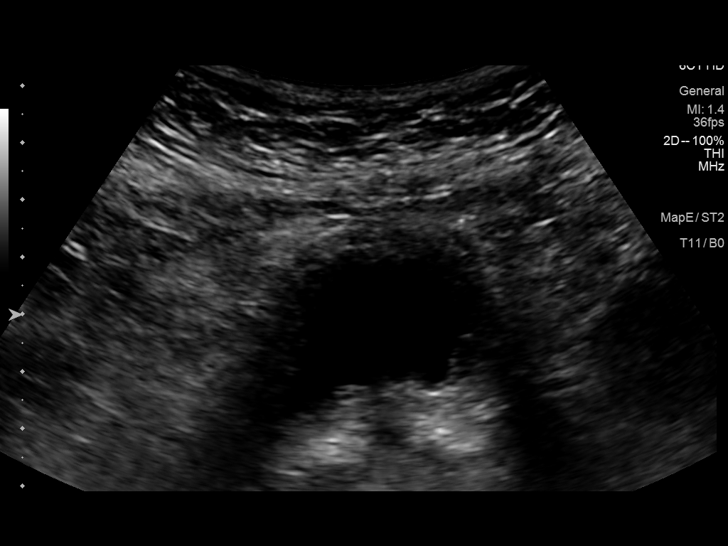
[im 71/71]
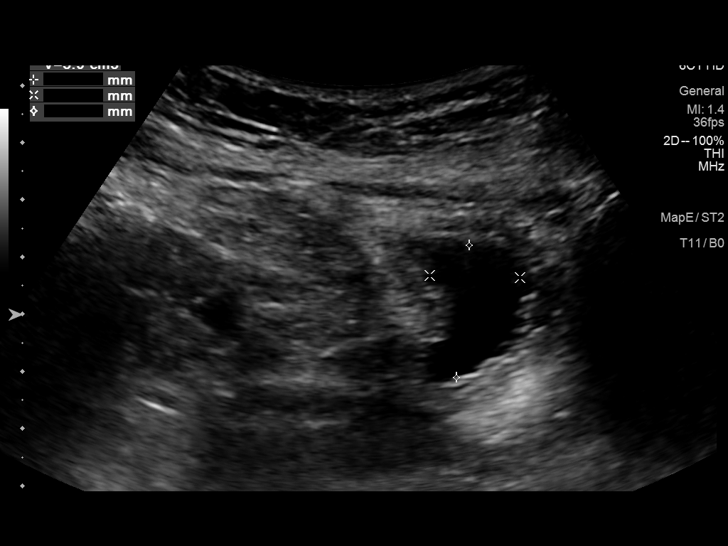

[14 of 25 positions shown; findings below may reference images not displayed]

FINDINGS: Right Kidney:

Renal measurements: 9.9 x 4.2 x 3.7 cm = volume: 80 mL .
Echogenicity within normal limits. No mass or hydronephrosis
visualized.

Left Kidney:

Renal measurements: 10.1 x 4.8 x 4.8 cm = volume: 121 mL.
Echogenicity within normal limits. No mass or hydronephrosis
visualized.

Bladder:

Appears normal for degree of bladder distention. Bilateral ureteral
jets are noted. Calculated prevoid volume of 71 mL. Calculated
postvoid volume of 6 mL.
IMPRESSION: Normal renal ultrasound.  No definite renal cyst is noted.

## 2020-04-05 ENCOUNTER — Other Ambulatory Visit: Payer: Self-pay | Admitting: Dermatology

## 2020-11-21 ENCOUNTER — Ambulatory Visit: Payer: No Typology Code available for payment source | Admitting: Dermatology
